# Patient Record
Sex: Female | Born: 2000 | Race: Black or African American | Hispanic: No | Marital: Single | Smoking: Never smoker
Health system: Southern US, Community
[De-identification: ages and names within clinical notes are randomized; demographics above are authoritative.]

## PROBLEM LIST (undated history)

## (undated) DIAGNOSIS — Z789 Other specified health status: Secondary | ICD-10-CM

## (undated) HISTORY — DX: Other specified health status: Z78.9

## (undated) HISTORY — PX: NO PAST SURGERIES: SHX2092

---
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
## 2020-02-15 DIAGNOSIS — Z419 Encounter for procedure for purposes other than remedying health state, unspecified: Secondary | ICD-10-CM | POA: Diagnosis not present

## 2020-03-07 ENCOUNTER — Ambulatory Visit: Payer: Medicaid Other | Admitting: Family Medicine

## 2020-03-08 ENCOUNTER — Other Ambulatory Visit: Payer: Self-pay

## 2020-03-08 ENCOUNTER — Ambulatory Visit (HOSPITAL_COMMUNITY)
Admission: EM | Admit: 2020-03-08 | Discharge: 2020-03-08 | Disposition: A | Discharge status: Home / Self Care | Payer: Medicaid Other | Attending: Family Medicine | Admitting: Family Medicine

## 2020-03-08 ENCOUNTER — Encounter (HOSPITAL_COMMUNITY): Payer: Self-pay

## 2020-03-08 DIAGNOSIS — R519 Headache, unspecified: Secondary | ICD-10-CM | POA: Diagnosis not present

## 2020-03-08 MED ORDER — NAPROXEN 500 MG PO TABS: 500.0000 mg | ORAL_TABLET | Freq: Two times a day (BID) | ORAL | 0 refills | Status: DC

## 2020-03-08 NOTE — ED Provider Notes (Signed)
[facility]    CSN: [id number] Arrival date & time: 03/08/20  1127      History   Chief Complaint Chief Complaint  Patient presents with  . Headache    HPI Kelli Kelly is a 19 y.o. female no significant past medical history presenting today for evaluation of a headache.  Patient reports that she woke up with a headache this morning.  Headache is been a throbbing sensation at the crown of her head.  Does report some brief mild blurring earlier, but denies at current.  She also had a tingling sensation in her left upper lip, left forearm and hand which has also resolved.  Denies weakness.  Denies chest pain or shortness of breath.  Does have history of headaches, but this was slightly worse than normal.  Did take some Benadryl earlier today, but no anti-inflammatories.  She did recently receive her first dose of the Pfizer vaccine 1 to 2 days ago.  Denies URI symptoms.  Denies fevers.  HPI  Past Medical History:  Diagnosis Date  . Medical history non-contributory     Patient Active Problem List   Diagnosis Date Noted  . Labor abnormality, delivered 12/19/2018  . Normal labor 12/16/2018    Past Surgical History:  Procedure Laterality Date  . NO PAST SURGERIES      OB History    Gravida  1   Para  0   Term  0   Preterm  0   AB  0   Living  0     SAB  0   TAB  0   Ectopic  0   Multiple  0   Live Births  0            Home Medications    Prior to Admission medications   Medication Sig Start Date End Date Taking? Authorizing Provider  naproxen (NAPROSYN) 500 MG tablet Take 1 tablet (500 mg total) by mouth 2 (two) times daily. 03/08/20   Reighlyn Elmes, Junius Creamer, PA-C    Family History Family History  Problem Relation Age of Onset  . Healthy Mother   . Healthy Father     Social History Social History   Tobacco Use  . Smoking status: Never Smoker  . Smokeless tobacco: Never Used  Vaping Use  . Vaping Use: Never used  Substance Use  Topics  . Alcohol use: Not Currently  . Drug use: Never     Allergies   Bactrim [sulfamethoxazole-trimethoprim]   Review of Systems Review of Systems  Constitutional: Negative for fatigue and fever.  HENT: Negative for congestion, sinus pressure and sore throat.   Eyes: Negative for photophobia, pain and visual disturbance.  Respiratory: Negative for cough and shortness of breath.   Cardiovascular: Negative for chest pain.  Gastrointestinal: Negative for abdominal pain, nausea and vomiting.  Genitourinary: Negative for decreased urine volume and hematuria.  Musculoskeletal: Negative for myalgias, neck pain and neck stiffness.  Neurological: Positive for headaches. Negative for dizziness, syncope, facial asymmetry, speech difficulty, weakness, light-headedness and numbness.     Physical Exam Triage Vital Signs ED Triage Vitals  Enc Vitals Group     BP 03/08/20 1236 (!) 129/76     Pulse Rate 03/08/20 1236 78     Resp 03/08/20 1236 16     Temp 03/08/20 1236 98 F (36.7 C)     Temp src --      SpO2 03/08/20 1237 99 %     Weight --  Height --      Head Circumference --      Peak Flow --      Pain Score 03/08/20 1236 5     Pain Loc --      Pain Edu? --      Excl. in GC? --    No data found.  Updated Vital Signs BP (!) 129/76   Pulse 78   Temp 98 F (36.7 C)   Resp 16   LMP 02/17/2020   SpO2 99%   Visual Acuity Right Eye Distance:   Left Eye Distance:   Bilateral Distance:    Right Eye Near:   Left Eye Near:    Bilateral Near:     Physical Exam Vitals and nursing note reviewed.  Constitutional:      Appearance: She is well-developed.     Comments: No acute distress  HENT:     Head: Normocephalic and atraumatic.     Ears:     Comments: Bilateral ears without tenderness to palpation of external auricle, tragus and mastoid, EAC's without erythema or swelling, TM's with good bony landmarks and cone of light. Non erythematous.     Nose: Nose normal.       Mouth/Throat:     Comments: Oral mucosa pink and moist, no tonsillar enlargement or exudate. Posterior pharynx patent and nonerythematous, no uvula deviation or swelling. Normal phonation. Eyes:     Extraocular Movements: Extraocular movements intact.     Conjunctiva/sclera: Conjunctivae normal.     Pupils: Pupils are equal, round, and reactive to light.  Cardiovascular:     Rate and Rhythm: Normal rate.  Pulmonary:     Effort: Pulmonary effort is normal. No respiratory distress.     Comments: Breathing comfortably at rest, CTABL, no wheezing, rales or other adventitious sounds auscultated Abdominal:     General: There is no distension.  Musculoskeletal:        General: Normal range of motion.     Cervical back: Neck supple.  Skin:    General: Skin is warm and dry.  Neurological:     Mental Status: She is alert and oriented to person, place, and time.     Comments: Patient A&O x3, cranial nerves II-XII grossly intact, strength at shoulders, hips and knees 5/5, equal bilaterally, patellar reflex 2+ bilaterally.Gait without abnormality.      UC Treatments / Results  Labs (all labs ordered are listed, but only abnormal results are displayed) Labs Reviewed - No data to display  EKG   Radiology No results found.  Procedures Procedures (including critical care time)  Medications Ordered in UC Medications - No data to display  Initial Impression / Assessment and Plan / UC Course  I have reviewed the triage vital signs and the nursing notes.  Pertinent labs & imaging results that were available during my care of the patient were reviewed by me and considered in my medical decision making (see chart for details).     Headache-no neuro deficits, no red flags, no nuchal rigidity, possible side effect of vaccine, recommending anti-inflammatories and close monitoring. Discussed strict return precautions. Patient verbalized understanding and is agreeable with plan.   Final  Clinical Impressions(s) / UC Diagnoses   Final diagnoses:  Acute nonintractable headache, unspecified headache type     Discharge Instructions     Naprosyn twice daily with food as needed for headache  Follow up if not improving or worsening, developing other symptoms with headache   ED Prescriptions  Medication Sig Dispense Auth. Provider   naproxen (NAPROSYN) 500 MG tablet Take 1 tablet (500 mg total) by mouth 2 (two) times daily. 30 tablet Quy Lotts, [place] C, PA-C     PDMP not reviewed this encounter.   Lew Dawes, PA-C 03/08/20 1302

## 2020-03-08 NOTE — ED Triage Notes (Signed)
Pt c/o headache since this AM, did not take anything at home for relief. States she has had intermittent headaches for a year but is concerned today's headache is related to COVID vaccine she got 2 days ago

## 2020-03-08 NOTE — Discharge Instructions (Signed)
Naprosyn twice daily with food as needed for headache  Follow up if not improving or worsening, developing other symptoms with headache

## 2020-03-17 DIAGNOSIS — Z419 Encounter for procedure for purposes other than remedying health state, unspecified: Secondary | ICD-10-CM | POA: Diagnosis not present

## 2020-04-17 DIAGNOSIS — Z419 Encounter for procedure for purposes other than remedying health state, unspecified: Secondary | ICD-10-CM | POA: Diagnosis not present

## 2020-05-07 DIAGNOSIS — Z113 Encounter for screening for infections with a predominantly sexual mode of transmission: Secondary | ICD-10-CM | POA: Diagnosis not present

## 2020-05-10 ENCOUNTER — Ambulatory Visit (HOSPITAL_COMMUNITY)
Admission: EM | Admit: 2020-05-10 | Discharge: 2020-05-10 | Disposition: A | Discharge status: Home / Self Care | Payer: Medicaid Other | Attending: Urgent Care | Admitting: Urgent Care

## 2020-05-10 ENCOUNTER — Encounter (HOSPITAL_COMMUNITY): Payer: Self-pay

## 2020-05-10 ENCOUNTER — Other Ambulatory Visit: Payer: Self-pay

## 2020-05-10 DIAGNOSIS — Z202 Contact with and (suspected) exposure to infections with a predominantly sexual mode of transmission: Secondary | ICD-10-CM | POA: Diagnosis not present

## 2020-05-10 DIAGNOSIS — Z3202 Encounter for pregnancy test, result negative: Secondary | ICD-10-CM

## 2020-05-10 LAB — POCT URINALYSIS DIPSTICK, ED / UC
Bilirubin Urine: NEGATIVE
Glucose, UA: NEGATIVE mg/dL
Hgb urine dipstick: NEGATIVE
Ketones, ur: NEGATIVE mg/dL
Nitrite: NEGATIVE
Protein, ur: NEGATIVE mg/dL
Specific Gravity, Urine: 1.025 (ref 1.005–1.030)
Urobilinogen, UA: 1 mg/dL (ref 0.0–1.0)
pH: 7 (ref 5.0–8.0)

## 2020-05-10 LAB — POC URINE PREG, ED: Preg Test, Ur: NEGATIVE

## 2020-05-10 MED ORDER — AZITHROMYCIN 250 MG PO TABS
ORAL_TABLET | ORAL | Status: AC
  Filled 2020-05-10: qty 4

## 2020-05-10 MED ORDER — AZITHROMYCIN 250 MG PO TABS
1000.0000 mg | ORAL_TABLET | Freq: Once | ORAL | Status: AC
  Administered 2020-05-10: 1000 mg via ORAL

## 2020-05-10 NOTE — ED Provider Notes (Signed)
[facility]    CSN: [id number] Arrival date & time: 05/10/20  0847      History   Chief Complaint Chief Complaint  Patient presents with  . STD Exposure    HPI Kelli Kelly is a 19 y.o. female.   Pt is a 19 year old female that presents with possible STD exposure.  Possible exposure to chlamydia.  Currently denies any symptoms.     Past Medical History:  Diagnosis Date  . Medical history non-contributory     Patient Active Problem List   Diagnosis Date Noted  . Labor abnormality, delivered 12/19/2018  . Normal labor 12/16/2018    Past Surgical History:  Procedure Laterality Date  . NO PAST SURGERIES      OB History    Gravida  1   Para  0   Term  0   Preterm  0   AB  0   Living  0     SAB  0   TAB  0   Ectopic  0   Multiple  0   Live Births  0            Home Medications    Prior to Admission medications   Medication Sig Start Date End Date Taking? Authorizing Provider  naproxen (NAPROSYN) 500 MG tablet Take 1 tablet (500 mg total) by mouth 2 (two) times daily. 03/08/20   Wieters, Junius Creamer, PA-C    Family History Family History  Problem Relation Age of Onset  . Healthy Mother   . Healthy Father     Social History Social History   Tobacco Use  . Smoking status: Never Smoker  . Smokeless tobacco: Never Used  Vaping Use  . Vaping Use: Never used  Substance Use Topics  . Alcohol use: Not Currently  . Drug use: Never     Allergies   Bactrim [sulfamethoxazole-trimethoprim]   Review of Systems Review of Systems   Physical Exam Triage Vital Signs ED Triage Vitals  Enc Vitals Group     BP 05/10/20 1026 109/65     Pulse Rate 05/10/20 1026 61     Resp 05/10/20 1026 18     Temp 05/10/20 1026 98.5 F (36.9 C)     Temp Source 05/10/20 1026 Oral     SpO2 05/10/20 1026 98 %     Weight --      Height --      Head Circumference --      Peak Flow --      Pain Score 05/10/20 1027 0     Pain Loc --       Pain Edu? --      Excl. in GC? --    No data found.  Updated Vital Signs BP 109/65 (BP Location: Right Arm)   Pulse 61   Temp 98.5 F (36.9 C) (Oral)   Resp 18   LMP 04/12/2020   SpO2 98%   Visual Acuity Right Eye Distance:   Left Eye Distance:   Bilateral Distance:    Right Eye Near:   Left Eye Near:    Bilateral Near:     Physical Exam Vitals and nursing note reviewed.  Constitutional:      General: She is not in acute distress.    Appearance: Normal appearance. She is not ill-appearing, toxic-appearing or diaphoretic.  HENT:     Head: Normocephalic.     Nose: Nose normal.  Eyes:     Conjunctiva/sclera: Conjunctivae normal.  Pulmonary:     Effort: Pulmonary effort is normal.  Musculoskeletal:        General: Normal range of motion.     Cervical back: Normal range of motion.  Skin:    General: Skin is warm and dry.     Findings: No rash.  Neurological:     Mental Status: She is alert.  Psychiatric:        Mood and Affect: Mood normal.      UC Treatments / Results  Labs (all labs ordered are listed, but only abnormal results are displayed) Labs Reviewed  POCT URINALYSIS DIPSTICK, ED / UC - Abnormal; Notable for the following components:      Result Value   Leukocytes,Ua TRACE (*)    All other components within normal limits  POC URINE PREG, ED  CERVICOVAGINAL ANCILLARY ONLY    EKG   Radiology No results found.  Procedures Procedures (including critical care time)  Medications Ordered in UC Medications  azithromycin (ZITHROMAX) tablet 1,000 mg (1,000 mg Oral Given 05/10/20 1154)    Initial Impression / Assessment and Plan / UC Course  I have reviewed the triage vital signs and the nursing notes.  Pertinent labs & imaging results that were available during my care of the patient were reviewed by me and considered in my medical decision making (see chart for details).     STD exposure Treated for chlamydia today based on exposure.  No  symptoms currently.  Swab sent for testing. Treated prophylactically based on exposure Final Clinical Impressions(s) / UC Diagnoses   Final diagnoses:  STD exposure     Discharge Instructions     Treated you for chlamydia today based on exposure.  Swab sent for testing. Follow up as needed for continued or worsening symptoms     ED Prescriptions    None     PDMP not reviewed this encounter.   Dahlia Byes A, NP 05/10/20 1503

## 2020-05-10 NOTE — Discharge Instructions (Addendum)
Treated you for chlamydia today based on exposure.  Swab sent for testing. Follow up as needed for continued or worsening symptoms

## 2020-05-10 NOTE — ED Triage Notes (Signed)
Pt presents for STD testing after and exposure to chlamydia from partner; pt states she is not having any symptoms.

## 2020-05-13 LAB — CERVICOVAGINAL ANCILLARY ONLY
Bacterial Vaginitis (gardnerella): NEGATIVE
Candida Glabrata: NEGATIVE
Candida Vaginitis: NEGATIVE
Chlamydia: POSITIVE — AB
Comment: NEGATIVE
Comment: NEGATIVE
Comment: NEGATIVE
Comment: NEGATIVE
Comment: NEGATIVE
Comment: NORMAL
Neisseria Gonorrhea: NEGATIVE
Trichomonas: NEGATIVE

## 2020-05-17 DIAGNOSIS — Z419 Encounter for procedure for purposes other than remedying health state, unspecified: Secondary | ICD-10-CM | POA: Diagnosis not present

## 2020-06-17 DIAGNOSIS — Z419 Encounter for procedure for purposes other than remedying health state, unspecified: Secondary | ICD-10-CM | POA: Diagnosis not present

## 2020-06-27 ENCOUNTER — Other Ambulatory Visit: Payer: Self-pay

## 2020-06-27 ENCOUNTER — Ambulatory Visit (HOSPITAL_COMMUNITY)
Admission: EM | Admit: 2020-06-27 | Discharge: 2020-06-27 | Disposition: A | Discharge status: Home / Self Care | Payer: Medicaid Other | Attending: Family Medicine | Admitting: Family Medicine

## 2020-06-27 ENCOUNTER — Encounter (HOSPITAL_COMMUNITY): Payer: Self-pay

## 2020-06-27 DIAGNOSIS — N898 Other specified noninflammatory disorders of vagina: Secondary | ICD-10-CM | POA: Diagnosis not present

## 2020-06-27 DIAGNOSIS — Z3202 Encounter for pregnancy test, result negative: Secondary | ICD-10-CM

## 2020-06-27 DIAGNOSIS — N760 Acute vaginitis: Secondary | ICD-10-CM | POA: Diagnosis not present

## 2020-06-27 DIAGNOSIS — Z202 Contact with and (suspected) exposure to infections with a predominantly sexual mode of transmission: Secondary | ICD-10-CM | POA: Diagnosis not present

## 2020-06-27 LAB — POCT URINALYSIS DIPSTICK, ED / UC
Bilirubin Urine: NEGATIVE
Glucose, UA: NEGATIVE mg/dL
Hgb urine dipstick: NEGATIVE
Ketones, ur: NEGATIVE mg/dL
Leukocytes,Ua: NEGATIVE
Nitrite: NEGATIVE
Protein, ur: NEGATIVE mg/dL
Specific Gravity, Urine: 1.02 (ref 1.005–1.030)
Urobilinogen, UA: 1 mg/dL (ref 0.0–1.0)
pH: 8.5 — ABNORMAL HIGH (ref 5.0–8.0)

## 2020-06-27 LAB — POC URINE PREG, ED: Preg Test, Ur: NEGATIVE

## 2020-06-27 NOTE — ED Provider Notes (Signed)
[facility]    CSN: [id number] Arrival date & time: 06/27/20  8101      History   Chief Complaint Chief Complaint  Patient presents with  . Exposure to STD  . Vaginal Discharge    HPI Kelli Kelly is a 19 y.o. female.   Patient presenting today with vaginal irritation, white discharge, and lower abdominal pressure. Denies fever, chills, N/V, flank pain. States a recent partner told her they had chlamydia. Not trying anything OTC for sxs at this time.      Past Medical History:  Diagnosis Date  . Medical history non-contributory     Patient Active Problem List   Diagnosis Date Noted  . Labor abnormality, delivered 12/19/2018  . Normal labor 12/16/2018    Past Surgical History:  Procedure Laterality Date  . NO PAST SURGERIES      OB History    Gravida  1   Para  0   Term  0   Preterm  0   AB  0   Living  0     SAB  0   TAB  0   Ectopic  0   Multiple  0   Live Births  0            Home Medications    Prior to Admission medications   Medication Sig Start Date End Date Taking? Authorizing Provider  naproxen (NAPROSYN) 500 MG tablet Take 1 tablet (500 mg total) by mouth 2 (two) times daily. 03/08/20   Wieters, Junius Creamer, PA-C    Family History Family History  Problem Relation Age of Onset  . Healthy Mother   . Healthy Father     Social History Social History   Tobacco Use  . Smoking status: Never Smoker  . Smokeless tobacco: Never Used  Vaping Use  . Vaping Use: Never used  Substance Use Topics  . Alcohol use: Not Currently  . Drug use: Never     Allergies   Bactrim [sulfamethoxazole-trimethoprim]   Review of Systems Review of Systems PER HPI    Physical Exam Triage Vital Signs ED Triage Vitals  Enc Vitals Group     BP 06/27/20 0903 108/69     Pulse Rate 06/27/20 0903 92     Resp 06/27/20 0903 18     Temp 06/27/20 0903 98.7 F (37.1 C)     Temp Source 06/27/20 0903 Oral     SpO2 06/27/20 0903  100 %     Weight --      Height --      Head Circumference --      Peak Flow --      Pain Score 06/27/20 0900 4     Pain Loc --      Pain Edu? --      Excl. in GC? --    No data found.  Updated Vital Signs BP 108/69 (BP Location: Left Arm)   Pulse 92   Temp 98.7 F (37.1 C) (Oral)   Resp 18   LMP 06/11/2020 (Exact Date)   SpO2 100%   Visual Acuity Right Eye Distance:   Left Eye Distance:   Bilateral Distance:    Right Eye Near:   Left Eye Near:    Bilateral Near:     Physical Exam Vitals and nursing note reviewed.  Constitutional:      Appearance: Normal appearance. She is not ill-appearing.  HENT:     Head: Atraumatic.  Eyes:  Extraocular Movements: Extraocular movements intact.     Conjunctiva/sclera: Conjunctivae normal.  Cardiovascular:     Rate and Rhythm: Normal rate and regular rhythm.     Heart sounds: Normal heart sounds.  Pulmonary:     Effort: Pulmonary effort is normal.     Breath sounds: Normal breath sounds.  Abdominal:     General: Bowel sounds are normal. There is no distension.     Palpations: Abdomen is soft.     Tenderness: There is no abdominal tenderness. There is no right CVA tenderness, left CVA tenderness or guarding.  Genitourinary:    Comments: GU exam declined, self swab performed Musculoskeletal:        General: Normal range of motion.     Cervical back: Normal range of motion and neck supple.  Skin:    General: Skin is warm and dry.  Neurological:     Mental Status: She is alert and oriented to person, place, and time.  Psychiatric:        Mood and Affect: Mood normal.        Thought Content: Thought content normal.        Judgment: Judgment normal.     UC Treatments / Results  Labs (all labs ordered are listed, but only abnormal results are displayed) Labs Reviewed  POCT URINALYSIS DIPSTICK, ED / UC - Abnormal; Notable for the following components:      Result Value   pH 8.5 (*)    All other components within  normal limits  POC URINE PREG, ED  CERVICOVAGINAL ANCILLARY ONLY    EKG   Radiology No results found.  Procedures Procedures (including critical care time)  Medications Ordered in UC Medications - No data to display  Initial Impression / Assessment and Plan / UC Course  I have reviewed the triage vital signs and the nursing notes.  Pertinent labs & imaging results that were available during my care of the patient were reviewed by me and considered in my medical decision making (see chart for details).     U/A neg for infection, urine preg neg, aptima swab pending. Tx based on results. Safe sexual practices and good vaginal hygiene reviewed.  Return if sxs worsening in meantime.   Final Clinical Impressions(s) / UC Diagnoses   Final diagnoses:  Acute vaginitis  Exposure to STD   Discharge Instructions   None    ED Prescriptions    None     PDMP not reviewed this encounter.   Particia Nearing, [place] 06/27/20 1040

## 2020-06-27 NOTE — ED Triage Notes (Signed)
Pt presents with vaginal discharge and lower abdominal pain x 2 days. She states she was exposed to a partner with Chlamydia.

## 2020-06-28 LAB — CERVICOVAGINAL ANCILLARY ONLY
Bacterial Vaginitis (gardnerella): NEGATIVE
Candida Glabrata: NEGATIVE
Candida Vaginitis: NEGATIVE
Chlamydia: NEGATIVE
Comment: NEGATIVE
Comment: NEGATIVE
Comment: NEGATIVE
Comment: NEGATIVE
Comment: NEGATIVE
Comment: NORMAL
Neisseria Gonorrhea: NEGATIVE
Trichomonas: NEGATIVE

## 2020-07-17 DIAGNOSIS — Z419 Encounter for procedure for purposes other than remedying health state, unspecified: Secondary | ICD-10-CM | POA: Diagnosis not present

## 2020-08-17 DIAGNOSIS — Z419 Encounter for procedure for purposes other than remedying health state, unspecified: Secondary | ICD-10-CM | POA: Diagnosis not present

## 2020-09-17 DIAGNOSIS — Z419 Encounter for procedure for purposes other than remedying health state, unspecified: Secondary | ICD-10-CM | POA: Diagnosis not present

## 2020-10-15 DIAGNOSIS — Z419 Encounter for procedure for purposes other than remedying health state, unspecified: Secondary | ICD-10-CM | POA: Diagnosis not present

## 2020-11-08 DIAGNOSIS — Z3202 Encounter for pregnancy test, result negative: Secondary | ICD-10-CM | POA: Diagnosis not present

## 2020-11-08 DIAGNOSIS — N898 Other specified noninflammatory disorders of vagina: Secondary | ICD-10-CM | POA: Diagnosis not present

## 2020-11-08 DIAGNOSIS — N760 Acute vaginitis: Secondary | ICD-10-CM | POA: Diagnosis not present

## 2020-11-11 DIAGNOSIS — Z113 Encounter for screening for infections with a predominantly sexual mode of transmission: Secondary | ICD-10-CM | POA: Diagnosis not present

## 2020-11-11 DIAGNOSIS — Z202 Contact with and (suspected) exposure to infections with a predominantly sexual mode of transmission: Secondary | ICD-10-CM | POA: Diagnosis not present

## 2020-11-15 DIAGNOSIS — Z419 Encounter for procedure for purposes other than remedying health state, unspecified: Secondary | ICD-10-CM | POA: Diagnosis not present

## 2020-11-16 DIAGNOSIS — Z113 Encounter for screening for infections with a predominantly sexual mode of transmission: Secondary | ICD-10-CM | POA: Diagnosis not present

## 2020-11-16 DIAGNOSIS — A749 Chlamydial infection, unspecified: Secondary | ICD-10-CM | POA: Diagnosis not present

## 2020-12-15 DIAGNOSIS — Z419 Encounter for procedure for purposes other than remedying health state, unspecified: Secondary | ICD-10-CM | POA: Diagnosis not present

## 2021-01-15 DIAGNOSIS — Z419 Encounter for procedure for purposes other than remedying health state, unspecified: Secondary | ICD-10-CM | POA: Diagnosis not present

## 2021-02-14 DIAGNOSIS — Z419 Encounter for procedure for purposes other than remedying health state, unspecified: Secondary | ICD-10-CM | POA: Diagnosis not present

## 2021-03-17 DIAGNOSIS — Z419 Encounter for procedure for purposes other than remedying health state, unspecified: Secondary | ICD-10-CM | POA: Diagnosis not present

## 2021-04-17 DIAGNOSIS — Z419 Encounter for procedure for purposes other than remedying health state, unspecified: Secondary | ICD-10-CM | POA: Diagnosis not present

## 2021-05-17 DIAGNOSIS — Z419 Encounter for procedure for purposes other than remedying health state, unspecified: Secondary | ICD-10-CM | POA: Diagnosis not present

## 2021-06-11 ENCOUNTER — Ambulatory Visit: Payer: Medicaid Other | Admitting: Emergency Medicine

## 2021-06-17 DIAGNOSIS — Z419 Encounter for procedure for purposes other than remedying health state, unspecified: Secondary | ICD-10-CM | POA: Diagnosis not present

## 2021-07-17 DIAGNOSIS — Z419 Encounter for procedure for purposes other than remedying health state, unspecified: Secondary | ICD-10-CM | POA: Diagnosis not present

## 2021-08-17 DIAGNOSIS — Z419 Encounter for procedure for purposes other than remedying health state, unspecified: Secondary | ICD-10-CM | POA: Diagnosis not present

## 2021-08-25 DIAGNOSIS — J029 Acute pharyngitis, unspecified: Secondary | ICD-10-CM | POA: Diagnosis not present

## 2021-09-17 DIAGNOSIS — Z419 Encounter for procedure for purposes other than remedying health state, unspecified: Secondary | ICD-10-CM | POA: Diagnosis not present

## 2021-10-15 DIAGNOSIS — Z419 Encounter for procedure for purposes other than remedying health state, unspecified: Secondary | ICD-10-CM | POA: Diagnosis not present

## 2021-10-25 DIAGNOSIS — B354 Tinea corporis: Secondary | ICD-10-CM | POA: Diagnosis not present

## 2021-11-02 DIAGNOSIS — Z113 Encounter for screening for infections with a predominantly sexual mode of transmission: Secondary | ICD-10-CM | POA: Diagnosis not present

## 2021-11-02 DIAGNOSIS — N898 Other specified noninflammatory disorders of vagina: Secondary | ICD-10-CM | POA: Diagnosis not present

## 2021-11-11 DIAGNOSIS — O3680X9 Pregnancy with inconclusive fetal viability, other fetus: Secondary | ICD-10-CM | POA: Diagnosis not present

## 2021-11-11 DIAGNOSIS — Z3A01 Less than 8 weeks gestation of pregnancy: Secondary | ICD-10-CM | POA: Diagnosis not present

## 2021-11-11 DIAGNOSIS — R109 Unspecified abdominal pain: Secondary | ICD-10-CM | POA: Diagnosis not present

## 2021-11-11 DIAGNOSIS — Z113 Encounter for screening for infections with a predominantly sexual mode of transmission: Secondary | ICD-10-CM | POA: Diagnosis not present

## 2021-11-15 DIAGNOSIS — Z419 Encounter for procedure for purposes other than remedying health state, unspecified: Secondary | ICD-10-CM | POA: Diagnosis not present

## 2021-11-19 DIAGNOSIS — R52 Pain, unspecified: Secondary | ICD-10-CM | POA: Diagnosis not present

## 2021-12-15 DIAGNOSIS — Z419 Encounter for procedure for purposes other than remedying health state, unspecified: Secondary | ICD-10-CM | POA: Diagnosis not present

## 2022-01-15 DIAGNOSIS — Z419 Encounter for procedure for purposes other than remedying health state, unspecified: Secondary | ICD-10-CM | POA: Diagnosis not present

## 2022-01-22 DIAGNOSIS — Z3040 Encounter for surveillance of contraceptives, unspecified: Secondary | ICD-10-CM | POA: Diagnosis not present

## 2022-01-22 DIAGNOSIS — Z124 Encounter for screening for malignant neoplasm of cervix: Secondary | ICD-10-CM | POA: Diagnosis not present

## 2022-01-22 DIAGNOSIS — Z113 Encounter for screening for infections with a predominantly sexual mode of transmission: Secondary | ICD-10-CM | POA: Diagnosis not present

## 2022-01-22 DIAGNOSIS — Z681 Body mass index (BMI) 19 or less, adult: Secondary | ICD-10-CM | POA: Diagnosis not present

## 2022-01-22 DIAGNOSIS — Z3201 Encounter for pregnancy test, result positive: Secondary | ICD-10-CM | POA: Diagnosis not present

## 2022-01-22 DIAGNOSIS — Z3009 Encounter for other general counseling and advice on contraception: Secondary | ICD-10-CM | POA: Diagnosis not present

## 2022-01-22 DIAGNOSIS — Z0000 Encounter for general adult medical examination without abnormal findings: Secondary | ICD-10-CM | POA: Diagnosis not present

## 2022-01-22 DIAGNOSIS — Z0001 Encounter for general adult medical examination with abnormal findings: Secondary | ICD-10-CM | POA: Diagnosis not present

## 2022-01-22 DIAGNOSIS — K649 Unspecified hemorrhoids: Secondary | ICD-10-CM | POA: Diagnosis not present

## 2022-01-30 DIAGNOSIS — O3680X9 Pregnancy with inconclusive fetal viability, other fetus: Secondary | ICD-10-CM | POA: Diagnosis not present

## 2022-02-10 DIAGNOSIS — Z3040 Encounter for surveillance of contraceptives, unspecified: Secondary | ICD-10-CM | POA: Diagnosis not present

## 2022-02-10 DIAGNOSIS — Z30013 Encounter for initial prescription of injectable contraceptive: Secondary | ICD-10-CM | POA: Diagnosis not present

## 2022-02-14 DIAGNOSIS — Z419 Encounter for procedure for purposes other than remedying health state, unspecified: Secondary | ICD-10-CM | POA: Diagnosis not present

## 2022-02-18 DIAGNOSIS — Z3042 Encounter for surveillance of injectable contraceptive: Secondary | ICD-10-CM | POA: Diagnosis not present

## 2022-03-17 DIAGNOSIS — Z419 Encounter for procedure for purposes other than remedying health state, unspecified: Secondary | ICD-10-CM | POA: Diagnosis not present

## 2022-04-17 DIAGNOSIS — Z419 Encounter for procedure for purposes other than remedying health state, unspecified: Secondary | ICD-10-CM | POA: Diagnosis not present

## 2022-05-03 DIAGNOSIS — N898 Other specified noninflammatory disorders of vagina: Secondary | ICD-10-CM | POA: Diagnosis not present

## 2022-05-03 DIAGNOSIS — R109 Unspecified abdominal pain: Secondary | ICD-10-CM | POA: Diagnosis not present

## 2022-05-12 DIAGNOSIS — Z3042 Encounter for surveillance of injectable contraceptive: Secondary | ICD-10-CM | POA: Diagnosis not present

## 2022-05-17 DIAGNOSIS — Z419 Encounter for procedure for purposes other than remedying health state, unspecified: Secondary | ICD-10-CM | POA: Diagnosis not present

## 2022-06-17 DIAGNOSIS — Z419 Encounter for procedure for purposes other than remedying health state, unspecified: Secondary | ICD-10-CM | POA: Diagnosis not present

## 2022-07-17 DIAGNOSIS — Z419 Encounter for procedure for purposes other than remedying health state, unspecified: Secondary | ICD-10-CM | POA: Diagnosis not present

## 2022-08-17 DIAGNOSIS — Z419 Encounter for procedure for purposes other than remedying health state, unspecified: Secondary | ICD-10-CM | POA: Diagnosis not present

## 2022-09-17 DIAGNOSIS — Z419 Encounter for procedure for purposes other than remedying health state, unspecified: Secondary | ICD-10-CM | POA: Diagnosis not present

## 2022-10-16 DIAGNOSIS — Z419 Encounter for procedure for purposes other than remedying health state, unspecified: Secondary | ICD-10-CM | POA: Diagnosis not present

## 2022-10-23 ENCOUNTER — Encounter (HOSPITAL_BASED_OUTPATIENT_CLINIC_OR_DEPARTMENT_OTHER): Payer: Self-pay | Admitting: Emergency Medicine

## 2022-10-23 ENCOUNTER — Emergency Department (HOSPITAL_BASED_OUTPATIENT_CLINIC_OR_DEPARTMENT_OTHER)
Admission: EM | Admit: 2022-10-23 | Discharge: 2022-10-24 | Disposition: A | Discharge status: Home / Self Care | Payer: Medicaid Other | Attending: Emergency Medicine | Admitting: Emergency Medicine

## 2022-10-23 ENCOUNTER — Emergency Department (HOSPITAL_BASED_OUTPATIENT_CLINIC_OR_DEPARTMENT_OTHER): Payer: Medicaid Other

## 2022-10-23 ENCOUNTER — Other Ambulatory Visit: Payer: Self-pay

## 2022-10-23 DIAGNOSIS — S60111A Contusion of right thumb with damage to nail, initial encounter: Secondary | ICD-10-CM | POA: Diagnosis not present

## 2022-10-23 DIAGNOSIS — W232XXA Caught, crushed, jammed or pinched between a moving and stationary object, initial encounter: Secondary | ICD-10-CM | POA: Diagnosis not present

## 2022-10-23 DIAGNOSIS — S6010XA Contusion of unspecified finger with damage to nail, initial encounter: Secondary | ICD-10-CM

## 2022-10-23 DIAGNOSIS — S6991XA Unspecified injury of right wrist, hand and finger(s), initial encounter: Secondary | ICD-10-CM | POA: Diagnosis not present

## 2022-10-23 NOTE — ED Triage Notes (Signed)
Right thumb injury, Slammed in car door yesterday. Blue-discoloration under nail bed swelling and painful.

## 2022-10-24 MED ORDER — NAPROXEN 500 MG PO TABS: 500.0000 mg | ORAL_TABLET | Freq: Two times a day (BID) | ORAL | 0 refills | Status: AC | PRN

## 2022-10-24 NOTE — Discharge Instructions (Signed)
Use ice and anti-inflammatories.  The blood under the nail should improve slowly over time.  Follow-up with a hand surgeon for further evaluation of it does not.  Return to the ED with new or worsening symptoms.

## 2022-10-24 NOTE — ED Provider Notes (Signed)
[facility] Provider Note   CSN: [id number] Arrival date & time: 10/23/22  2321     History  Chief Complaint  Patient presents with   Finger Injury    Kelli Kelly is a 22 y.o. female.  Crush injury to right thumb sustained yesterday when she was slammed in the car door about 6 PM.  Concerned about dark discoloration to the nails with increased pain today.  Feels numbness and tingling to her fingertip.  No weakness.  No open wounds.  No other medical problems.  Tetanus thought to be up-to-date.  The history is provided by the patient.       Home Medications Prior to Admission medications   Medication Sig Start Date End Date Taking? Authorizing Provider  naproxen (NAPROSYN) 500 MG tablet Take 1 tablet (500 mg total) by mouth 2 (two) times daily. 03/08/20   Wieters, Hallie C, PA-C      Allergies    Bactrim [sulfamethoxazole-trimethoprim]    Review of Systems   Review of Systems  Constitutional:  Negative for activity change, appetite change and fever.  HENT:  Negative for congestion.   Respiratory:  Negative for cough, chest tightness and shortness of breath.   Cardiovascular:  Negative for chest pain.  Gastrointestinal:  Negative for abdominal pain.  Genitourinary:  Negative for dysuria and hematuria.  Musculoskeletal:  Positive for arthralgias and myalgias.  Skin:  Positive for wound.  Neurological:  Negative for dizziness, weakness and headaches.   all other systems are negative except as noted in the HPI and PMH.    Physical Exam Updated Vital Signs BP 126/79 (BP Location: Right Arm)   Pulse (!) 110   Temp 98.2 F (36.8 C) (Oral)   Resp 18   LMP 09/09/2022   SpO2 99%  Physical Exam Vitals and nursing note reviewed.  Constitutional:      General: She is not in acute distress.    Appearance: She is well-developed.  HENT:     Head: Normocephalic and atraumatic.     Mouth/Throat:     Pharynx: No oropharyngeal  exudate.  Eyes:     Conjunctiva/sclera: Conjunctivae normal.     Pupils: Pupils are equal, round, and reactive to light.  Neck:     Comments: No meningismus. Cardiovascular:     Rate and Rhythm: Normal rate and regular rhythm.     Heart sounds: Normal heart sounds. No murmur heard. Pulmonary:     Effort: Pulmonary effort is normal. No respiratory distress.     Breath sounds: Normal breath sounds.  Abdominal:     Palpations: Abdomen is soft.     Tenderness: There is no abdominal tenderness. There is no guarding or rebound.  Musculoskeletal:        General: Tenderness and signs of injury present. Normal range of motion.     Cervical back: Normal range of motion and neck supple.     Comments: Subungual hematoma to right thumb approximately 50% of the nailbed.  Full range of motion of IP and MCP joint. No open wounds.  Skin:    General: Skin is warm.  Neurological:     Mental Status: She is alert and oriented to person, place, and time.     Cranial Nerves: No cranial nerve deficit.     Motor: No abnormal muscle tone.     Coordination: Coordination normal.     Comments:  5/5 strength throughout. CN 2-12 intact.Equal grip strength.   Psychiatric:  Behavior: Behavior normal.     ED Results / Procedures / Treatments   Labs (all labs ordered are listed, but only abnormal results are displayed) Labs Reviewed - No data to display  EKG None  Radiology DG Finger Thumb Right  Result Date: 10/24/2022 CLINICAL DATA:  Crush injury to the thumb. Slammed in car door. EXAM: RIGHT THUMB 2+V COMPARISON:  None Available. FINDINGS: There is no evidence of fracture or dislocation. Normal joint spaces and alignment. There is no evidence of arthropathy or other focal bone abnormality. Soft tissues are unremarkable. IMPRESSION: Negative radiographs of the right thumb. Electronically Signed   By: Keith Rake M.D.   On: 10/24/2022 00:00    Procedures Procedures    Medications Ordered in  ED Medications - No data to display  ED Course/ Medical Decision Making/ A&P                             Medical Decision Making Amount and/or Complexity of Data Reviewed Labs: ordered. Decision-making details documented in ED Course. Radiology: ordered and independent interpretation performed. Decision-making details documented in ED Course. ECG/medicine tests: ordered and independent interpretation performed. Decision-making details documented in ED Course.  Risk Prescription drug management.  Subungual hematoma to right thumb nailbed.  No open wounds.  Vital stable.  X-ray negative for fracture, results reviewed and interpreted by me  Unclear whether any benefit of attempted trephination at this point given >24 hours old and blood likely clotted.  Patient agreeable to attempted trephination.  Discussed that blood is likely already clotted and may not be successful.  She is willing to proceed.  Trephination attempted with electrocautery. Minimal blood removed.  Blood appears to be clotted.  Will treat supportively with anti-inflammatories, ice and follow-up with hand surgery. Return precautions discussed.         Final Clinical Impression(s) / ED Diagnoses Final diagnoses:  Subungual hematoma of digit of hand, initial encounter    Rx / DC Orders ED Discharge Orders     None         Azhar Knope, Annie Main, MD 10/24/22 [phone]

## 2022-10-28 DIAGNOSIS — M79644 Pain in right finger(s): Secondary | ICD-10-CM | POA: Diagnosis not present

## 2022-10-28 DIAGNOSIS — S6010XA Contusion of unspecified finger with damage to nail, initial encounter: Secondary | ICD-10-CM | POA: Diagnosis not present

## 2022-11-16 DIAGNOSIS — Z419 Encounter for procedure for purposes other than remedying health state, unspecified: Secondary | ICD-10-CM | POA: Diagnosis not present

## 2022-12-16 DIAGNOSIS — Z419 Encounter for procedure for purposes other than remedying health state, unspecified: Secondary | ICD-10-CM | POA: Diagnosis not present

## 2023-01-16 DIAGNOSIS — Z419 Encounter for procedure for purposes other than remedying health state, unspecified: Secondary | ICD-10-CM | POA: Diagnosis not present

## 2023-02-15 DIAGNOSIS — Z419 Encounter for procedure for purposes other than remedying health state, unspecified: Secondary | ICD-10-CM | POA: Diagnosis not present

## 2023-02-22 DIAGNOSIS — Z3202 Encounter for pregnancy test, result negative: Secondary | ICD-10-CM | POA: Diagnosis not present

## 2023-02-22 DIAGNOSIS — Z113 Encounter for screening for infections with a predominantly sexual mode of transmission: Secondary | ICD-10-CM | POA: Diagnosis not present

## 2023-02-22 DIAGNOSIS — N760 Acute vaginitis: Secondary | ICD-10-CM | POA: Diagnosis not present

## 2023-03-18 DIAGNOSIS — Z419 Encounter for procedure for purposes other than remedying health state, unspecified: Secondary | ICD-10-CM | POA: Diagnosis not present
